# Patient Record
Sex: Female | Born: 1960 | Race: White | Hispanic: No | Smoking: Never smoker
Health system: Southern US, Community
[De-identification: ages and names within clinical notes are randomized; demographics above are authoritative.]

---

## 2007-12-17 ENCOUNTER — Encounter: Payer: Self-pay | Admitting: Internal Medicine

## 2007-12-18 ENCOUNTER — Encounter: Payer: Self-pay | Admitting: Internal Medicine

## 2008-01-18 ENCOUNTER — Encounter: Payer: Self-pay | Admitting: Internal Medicine

## 2008-02-15 ENCOUNTER — Encounter: Payer: Self-pay | Admitting: Internal Medicine

## 2008-04-30 ENCOUNTER — Encounter: Payer: Self-pay | Admitting: Internal Medicine

## 2008-05-17 ENCOUNTER — Encounter: Payer: Self-pay | Admitting: Internal Medicine

## 2008-06-16 ENCOUNTER — Encounter: Payer: Self-pay | Admitting: Internal Medicine

## 2012-04-28 ENCOUNTER — Ambulatory Visit: Payer: Self-pay

## 2012-04-28 LAB — RAPID INFLUENZA A&B ANTIGENS

## 2013-03-12 ENCOUNTER — Ambulatory Visit: Payer: Self-pay

## 2013-03-12 LAB — RAPID INFLUENZA A&B ANTIGENS

## 2013-03-26 ENCOUNTER — Ambulatory Visit: Payer: Self-pay

## 2014-05-18 LAB — BASIC METABOLIC PANEL
Anion Gap: 6 — ABNORMAL LOW (ref 7–16)
BUN: 19 mg/dL — ABNORMAL HIGH (ref 7–18)
Calcium, Total: 8.5 mg/dL (ref 8.5–10.1)
Chloride: 105 mmol/L (ref 98–107)
Co2: 26 mmol/L (ref 21–32)
Creatinine: 0.89 mg/dL (ref 0.60–1.30)
EGFR (African American): >60
GLUCOSE: 80 mg/dL (ref 65–99)
OSMOLALITY: 275 (ref 275–301)
Potassium: 3.9 mmol/L (ref 3.5–5.1)
SODIUM: 137 mmol/L (ref 136–145)

## 2014-05-18 LAB — CBC
HCT: 40.6 % (ref 35.0–47.0)
HGB: 13.3 g/dL (ref 12.0–16.0)
MCH: 27.8 pg (ref 26.0–34.0)
MCHC: 32.7 g/dL (ref 32.0–36.0)
MCV: 85 fL (ref 80–100)
Platelet: 244 10*3/uL (ref 150–440)
RBC: 4.78 10*6/uL (ref 3.80–5.20)
RDW: 14.1 % (ref 11.5–14.5)
WBC: 12.4 10*3/uL — ABNORMAL HIGH (ref 3.6–11.0)

## 2014-05-18 LAB — PROTIME-INR
INR: 1.1
Prothrombin Time: 13.6 secs (ref 11.5–14.7)

## 2014-05-18 LAB — TROPONIN I: Troponin-I: 0.05 ng/mL

## 2014-05-19 ENCOUNTER — Inpatient Hospital Stay: Payer: Self-pay | Admitting: Internal Medicine

## 2014-05-19 LAB — APTT
Activated PTT: 28.1 secs (ref 23.6–35.9)
Activated PTT: 88.4 secs — ABNORMAL HIGH (ref 23.6–35.9)

## 2014-05-19 LAB — TROPONIN I
Troponin-I: 0.07 ng/mL — ABNORMAL HIGH
Troponin-I: 0.12 ng/mL — ABNORMAL HIGH

## 2014-05-19 LAB — CK-MB
CK-MB: 0.9 ng/mL (ref 0.5–3.6)
CK-MB: 1.1 ng/mL (ref 0.5–3.6)
CK-MB: 1.2 ng/mL (ref 0.5–3.6)

## 2014-05-20 LAB — CBC WITH DIFFERENTIAL/PLATELET
BASOS ABS: 0.1 10*3/uL (ref 0.0–0.1)
Basophil %: 0.6 %
EOS PCT: 2 %
Eosinophil #: 0.2 10*3/uL (ref 0.0–0.7)
HCT: 37.5 % (ref 35.0–47.0)
HGB: 12.3 g/dL (ref 12.0–16.0)
Lymphocyte #: 2.5 10*3/uL (ref 1.0–3.6)
Lymphocyte %: 22.5 %
MCH: 28.2 pg (ref 26.0–34.0)
MCHC: 32.7 g/dL (ref 32.0–36.0)
MCV: 86 fL (ref 80–100)
Monocyte #: 1 x10 3/mm — ABNORMAL HIGH (ref 0.2–0.9)
Monocyte %: 8.9 %
NEUTROS ABS: 7.3 10*3/uL — AB (ref 1.4–6.5)
NEUTROS PCT: 66 %
PLATELETS: 199 10*3/uL (ref 150–440)
RBC: 4.35 10*6/uL (ref 3.80–5.20)
RDW: 14.4 % (ref 11.5–14.5)
WBC: 11.1 10*3/uL — ABNORMAL HIGH (ref 3.6–11.0)

## 2014-05-20 LAB — BASIC METABOLIC PANEL
Anion Gap: 5 — ABNORMAL LOW (ref 7–16)
BUN: 17 mg/dL (ref 7–18)
CALCIUM: 8.5 mg/dL (ref 8.5–10.1)
CO2: 29 mmol/L (ref 21–32)
Chloride: 105 mmol/L (ref 98–107)
Creatinine: 0.88 mg/dL (ref 0.60–1.30)
EGFR (African American): >60
EGFR (Non-African Amer.): >60
Glucose: 103 mg/dL — ABNORMAL HIGH (ref 65–99)
OSMOLALITY: 279 (ref 275–301)
Potassium: 4.4 mmol/L (ref 3.5–5.1)
Sodium: 139 mmol/L (ref 136–145)

## 2014-05-20 LAB — PROTIME-INR
INR: 1.4
PROTHROMBIN TIME: 16.8 s — AB (ref 11.5–14.7)

## 2014-05-20 LAB — APTT: Activated PTT: 31.5 secs (ref 23.6–35.9)

## 2015-04-09 NOTE — Discharge Summary (Signed)
PATIENT NAME:  Tonya Clayton, Tonya Clayton MR#:  [id number] DATE OF BIRTH:  Dec 15, 1961  DATE OF ADMISSION:  05/19/2014 DATE OF DISCHARGE:  05/20/2014  PRIMARY CARE PHYSICIAN: Nonlocal.  FINAL DIAGNOSES: 1.  Acute pulmonary embolism and left leg deep vein thrombosis.  2.  Heavy menses.  3.  Obesity.   MEDICATIONS ON DISCHARGE:  Include Xarelto 15 mg twice a day for 20 more days, then go back to your usual 20 mg daily dosing lifelong afterwards; oxycodone 5 mg every 6 hours as needed for pain.   DIET: Regular diet, regular consistency.   ACTIVITY: As tolerated.   Can follow up with Dr. Greggory Keen, gynecology, as needed for heavy menses.     HOSPITAL COURSE: The patient was admitted 05/19/2014, and discharged 05/20/2014. Came in with shortness of breath.   HISTORY OF PRESENT ILLNESS: A 54 year old female with history of DVT and PE, comes in with shortness of breath for 2 days. She was diagnosed with pulmonary embolism back in the 1990s. She developed another clot back in April 2014. She has been on Xarelto.  Two weeks back she stopped her Xarelto secondary to heavy menstruation and comes in with further shortness of breath. She did have an IVC filter last year. In the ER, she was diagnosed with pulmonary embolism and hospitalist services admitted the patient and started the patient on heparin drip.   LABORATORY AND RADIOLOGICAL DATA DURING THE HOSPITAL COURSE: Included an EKG that showed normal sinus rhythm. No acute ST-T wave changes. Glucose 80, BUN 19, creatinine 0.89, sodium 137, potassium 3.9, chloride 105, CO2 of 26, calcium 8.5. White blood cell count 12.4, Hemoglobin 13.3, hematocrit 40.6, platelet count of 244. Troponin negative. Chest x-ray showed no active cardiopulmonary disease. Troponin borderline at 0.07. CT angio of the chest showed positive for acute pulmonary embolism, right heart strain consistent with PE. 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  Echocardiogram showed an EF of 60% to 65%. Elevated mean left atrial pressure. Impaired relaxation of LV diastolic filling. Enlarged right ventricle. Left atrium is normal. Moderately dilated right atrium. Ultrasound of the lower extremity, bilateral, showed occlusive left popliteal DVT.  Hemoglobin upon discharge 12.3, creatinine 0.88.   The patient was seen in consultation by Dr. Greggory Keen, who recommended maintaining a menstrual calendar over the next 3 months and notify him if bleeding greater than 7 days or excessive bleeding. No medication at this time.   HOSPITAL COURSE PER PROBLEM LIST:  1.  For the patient's acute pulmonary embolism and left leg deep vein thrombosis, the patient was advised not to stop her Xarelto. Since she did have an acute pulmonary embolism, I had to change the dose of the Xarelto to 15 mg twice a day for 20 days, and then go back to her usual 20 mg daily. She will need to be on anticoagulation lifelong secondary to repeated blood clots.  2.  Heavy menses. Can follow up with Dr. Greggory Keen. Needs to stay on the anticoagulation at this point.  3. Obesity with a body mass index of 40.3. Weight loss is needed.   Of note, benefits and risks of blood thinner explained to the patient. The patient has to stay on the Xarelto at this point in time to prevent complications of a blood clot. Risks of the blood thinner explained to the patient at length.    TIME SPENT ON DISCHARGE: 35 minutes.    ____________________________ Herschell Dimes. Renae Gloss, MD rjw:dmm D: 05/20/2014 15:02:00 ET  T: 05/20/2014 20:03:54 ET JOB#: [id number]  cc: Herschell Dimesichard J. Renae GlossWieting, MD, <Dictator> Prentice DockerMartin A. DeFrancesco, MD Salley ScarletICHARD J Topanga Alvelo MD ELECTRONICALLY SIGNED 05/25/2014 10:20

## 2015-04-09 NOTE — Consult Note (Signed)
PATIENT NAME:  Tonya Clayton, [name] MR#:  [id number] DATE OF BIRTH:  24-Jul-1961  DATE OF CONSULTATION:  05/19/2014  REFERRING PHYSICIAN:  Alford Highlandichard Wieting, MD CONSULTING PHYSICIAN:  Prentice DockerMartin A. Albion Weatherholtz, MD  REASON FOR CONSULTATION:  Heavy periods.  HISTORY OF PRESENT ILLNESS: The patient is a 54 year old white female, para 0-1-0-0, having regular menstrual cycles on a monthly basis until May 2015 when she had an episode of amenorrhea followed by a heavy cycle lasting less than 7 days duration. The patient was recently admitted with recurrent DVT and pulmonary embolism for anticoagulation.   PAST GYNECOLOGIC HISTORY: Notable for regular menstrual cycles with intervals approximately every 28 days. Duration of flow typically would last 5 to 6 days. She denies history of intermenstrual bleeding. The patient states that her cycles have been regular up until April when she had an episode of amenorrhea. Subsequent menses in May was noted to be heavy.  SIGNIFICANT PAST HISTORY:  Notable for the patient having had DVT and pulmonary embolus in the past which prompted her to be long-term Xarelto therapy. She had since discontinued the medication because of concern about her cycles.  PAST MEDICAL HISTORY:  Past history of DVT and PE.   PAST SURGICAL HISTORY: IVC filter placement.  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   FAMILY HISTORY: Positive for hypertension. No family history of pulmonary embolism.   SOCIAL HISTORY: The patient does not smoke, does not drink, does not use drugs.   PHYSICAL EXAMINATION: Deferred at this time.   IMPRESSION: 1.  New onset recurrent deep vein thrombosis/pulmonary embolism.  2.  Perimenopause with recent heavy menses following 2 months of amenorrhea. Bleeding not prolonged. Previous history of menses on Xarelto have been normal.   RECOMMENDATIONS:  1.  Maintain menstrual calendar monitor over the next 3 months. Notify me if  there is any bleeding greater than 7 days or if there is any excessive bleeding.  2.  Follow up with me in 3 months for reassessment.  3.  No medication intervention is necessary at this time unless bleeding becomes excessive, heavy or prolonged.    ____________________________ Prentice DockerMartin A. Saida Lonon, MD mad:ce D: 05/19/2014 14:07:21 ET T: 05/19/2014 14:26:16 ET JOB#: [id number]  cc: Daphine DeutscherMartin A. Katheryne Gorr, MD, <Dictator> [facility] Prentice DockerMARTIN A Heide Brossart MD ELECTRONICALLY SIGNED 06/16/2014 17:08

## 2015-04-09 NOTE — H&P (Signed)
PATIENT NAME:  Tonya Clayton, Tonya Clayton MR#:  [id number] DATE OF BIRTH:  07-24-1961  DATE OF ADMISSION:  05/19/2014  PRIMARY CARE PHYSICIAN:  Nonlocal.   REFERRING PHYSICIAN:  Dr. Sharman Clayton.   CHIEF COMPLAINT:  Shortness of breath.   HISTORY OF PRESENT ILLNESS:  Ms. Tonya Clayton is a 54 year old, pleasant, white female with a past medical history of DVT, PE that presented to the Emergency Department with complaints of shortness of breath for the last two days.  The patient was initially diagnosed with a pulmonary embolism in the 1990s.  At that time it was attributed to birth control pills.  The patient took Coumadin for a year and was discontinued.  The patient developed another clot in April 2014.  Since then, the patient has been on Xarelto.  About two weeks back, the patient was having heavy menstruation.  Concerning this, the patient took herself of Xarelto.  For the last two days, has been experiencing shortness of breath with mild exertion.  The patient has to rest for some time before she walked.  Had mild chest pain.  Denies having any cough.  The patient usually sits most of the day at work.  Noticed to have increased swelling in the left lower extremity.  The patient had IVC filter placed last year.  Work-up in the Emergency Department, the patient is found to have right lung submassive PE.  The patient was initiated on heparin drip.  PAST MEDICAL HISTORY:  Previous history of pulmonary embolism.   PAST SURGICAL HISTORY:  IVC filter placement.   ALLERGIES:  No known drug allergies.   HOME MEDICATIONS:  Currently none.   SOCIAL HISTORY:  No history of smoking, drinking alcohol or using illicit.  Lives with her boyfriend.  Works for [employer].   FAMILY HISTORY:  No family history of pulmonary embolism.  Has history of hypertension.   REVIEW OF SYSTEMS: CONSTITUTIONAL:  Denies any generalized weakness.  EYES:  No change in vision.   EARS, NOSE, THROAT:  No change in hearing.  RESPIRATORY:   Has shortness of breath.  CARDIOVASCULAR:  Has mild chest pain.  GASTROINTESTINAL:  No nausea, vomiting, abdominal pain.  GENITOURINARY:  No dysuria or hematuria.  ENDOCRINE:  No polyuria or polydipsia.  HEMATOLOGY:  No easy bruising or bleeding.  SKIN:  No rash or lesions. MUSCULOSKELETAL:  No joint pains and aches. NEUROLOGIC:  No weakness or numbness in any part of the body.   PHYSICAL EXAMINATION:  GENERAL:  This is a well-built, well-nourished, age-appropriate female lying down in the bed, not in distress.  VITAL SIGNS:  Temperature 98.5, pulse 76, blood pressure 135/85, respiratory rate of 16, oxygen saturation 99% on room air.  HEENT:  Head normocephalic, atraumatic.  Eyes, no scleral icterus.  Conjunctivae normal.  Pupils equal and react to light.  Extraocular movements are intact.  Mucous membranes moist.  No pharyngeal erythema.  NECK:  Supple.  No lymphadenopathy.  No JVD.  No carotid bruit.  No thyromegaly.  CHEST:  Has no focal tenderness.  LUNGS:  Bilaterally clear to auscultation.  HEART:  S1, S2 regular.  No murmurs are heard.  ABDOMEN:  Bowel sounds plus.  Soft, nontender, nondistended.  Obese abdomen.  Could not appreciate any hepatosplenomegaly.  EXTREMITIES:  Left lower extremity is bigger than the right, has 1 to 2+ pitting edema extending up to the thighs.  SKIN:  No rashes or lesions.  MUSCULOSKELETAL:  Good range of motion in all the extremities.  NEUROLOGIC:  The patient is alert, oriented to place, person, and time.  Cranial nerves II through XII intact.  Motor 5 by 5 in upper and lower extremities.   LABORATORY DATA:  Troponin 0.07.  CTA of the chest showed positive for acute pulmonary embolism with right heart strain consistent with at least submassive pulmonary embolism, right heart strain associated with increased risk of morbidity and mortality.  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  Recommended abdominal CT.  Chest  x-ray, one view portable:  No acute cardiopulmonary disease.   Coag profile, within normal limits.  CBC, CMP are completely within normal limits.  Troponin 0.05.   EKG, 12-lead:  Shows normal sinus rhythm with no ST-T wave abnormalities.      ASSESSMENT AND PLAN:  Ms. Tonya Clayton is a 54 year old female with a known history of pulmonary embolism who comes to the Emergency Department with pulmonary embolism.  1.  Pulmonary embolism.  The patient has recurrent deep vein thrombosis and pulmonary embolism.  The patient does not know if any coagulopathy work-up was done in the past.  We will send the workup.  We will obtain echocardiogram, admit the patient to the monitored bed.  Continue with heparin drip.  We will obtain venous Dopplers. 2.  Obesity.  Counseled with the patient regarding diet and exercise.   TIME SPENT:  45 minutes.    ____________________________ Tonya GriffinsPadmaja Jahaan Vanwagner, MD pv:ea D: 05/19/2014 03:40:24 ET T: 05/19/2014 04:33:11 ET JOB#: [id number]  cc: Tonya GriffinsPadmaja Alpa Salvo, MD, <Dictator> Tonya GriffinsPADMAJA Alonni Heimsoth MD ELECTRONICALLY SIGNED 05/27/2014 21:07

## 2015-08-17 IMAGING — CT CT ANGIO CHEST
2 of 6 series · 18 of 36 positions shown · IV contrast (APPLIED)
Comparison: None.

CLINICAL DATA: Retrosternal chest pain. History of pulmonary
embolism.

EXAM:
CT ANGIOGRAPHY CHEST WITH CONTRAST
TECHNIQUE: Multidetector CT imaging of the chest was performed using the
standard protocol during bolus administration of intravenous
contrast. Multiplanar CT image reconstructions and MIPs were
obtained to evaluate the vascular anatomy.
CONTRAST:  100 cc Isovue 370 intravenous

[Series 5: pe 1.0 thins · axial · 0.66mm/px · z∈[-321,-104]mm · 17 of 245 slices shown]
[im 14/245  lung]
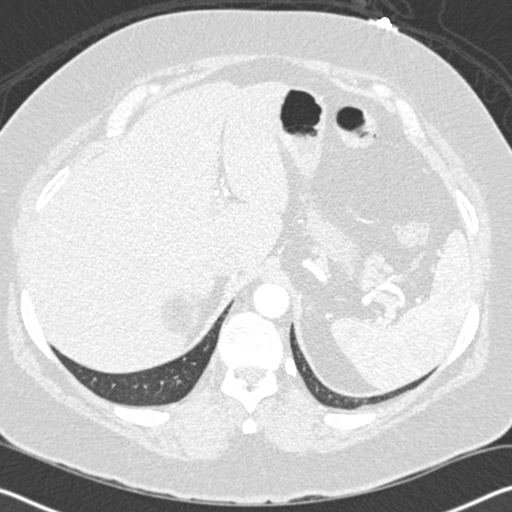
[im 28/245  mediastinal]
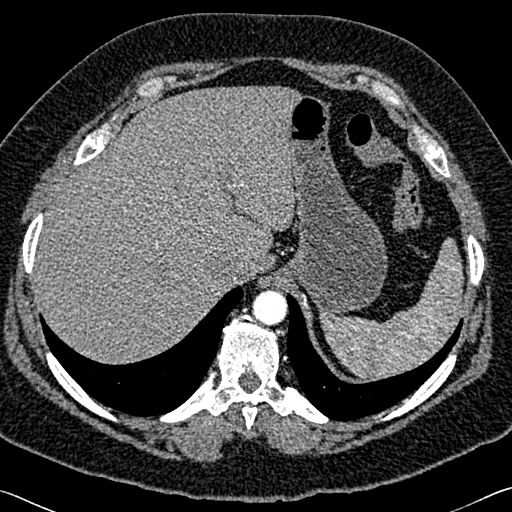
[im 41/245  lung]
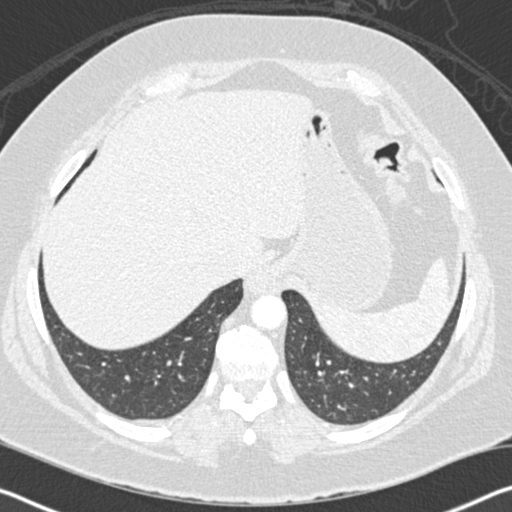
[im 55/245  mediastinal]
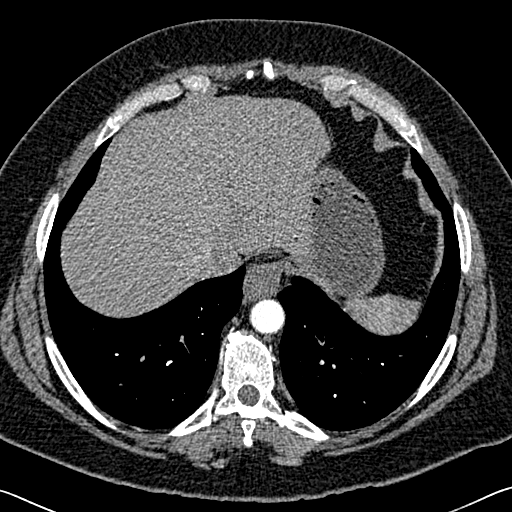
[im 68/245  lung]
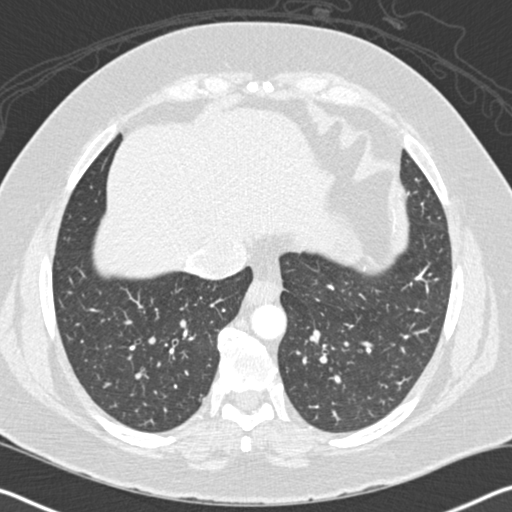
[im 82/245  mediastinal]
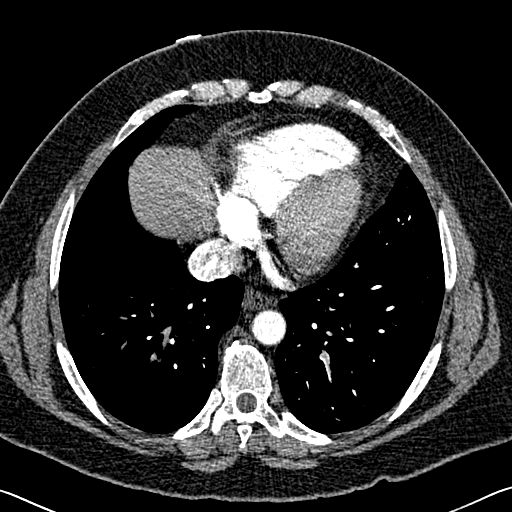
[im 95/245  lung]
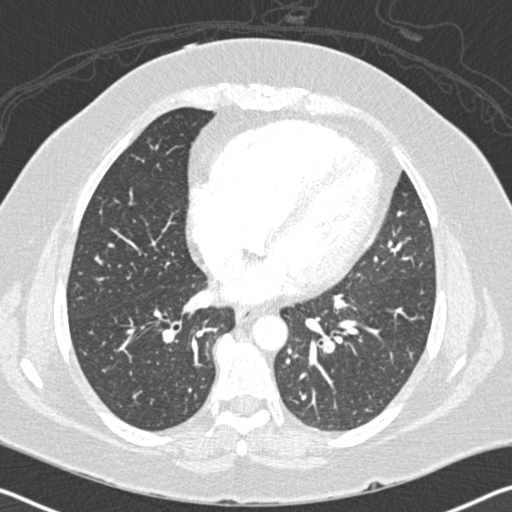
[im 109/245  mediastinal]
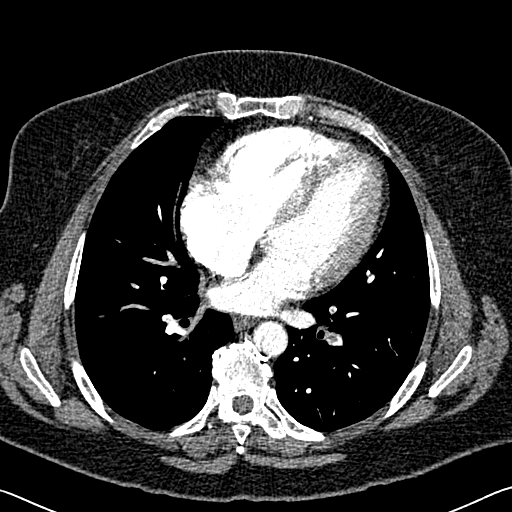
[im 123/245  lung]
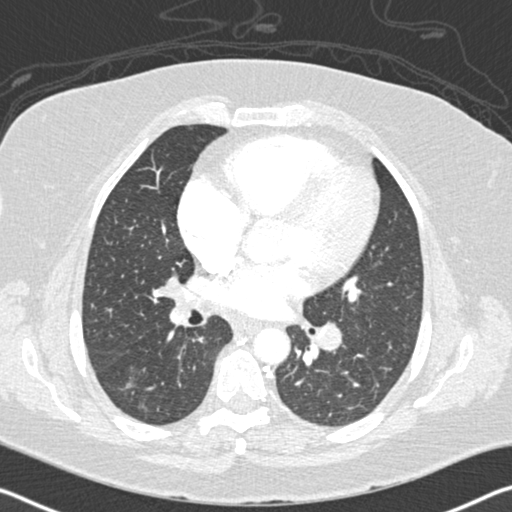
[im 136/245  mediastinal]
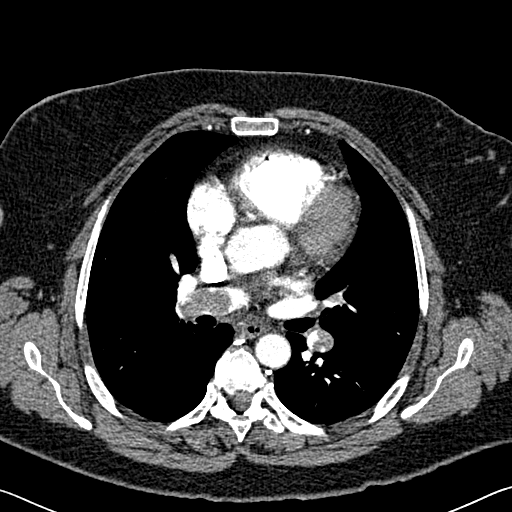
[im 150/245  lung]
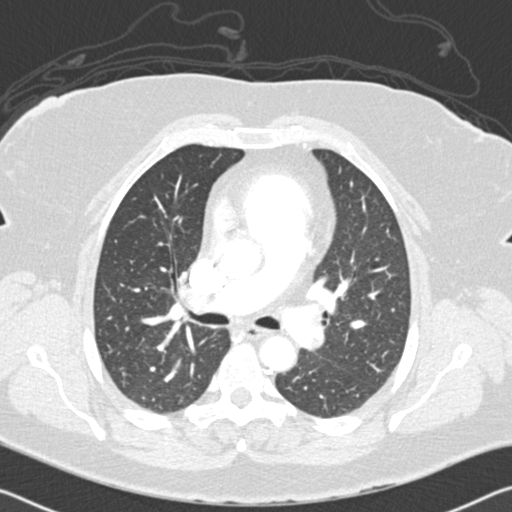
[im 163/245  mediastinal]
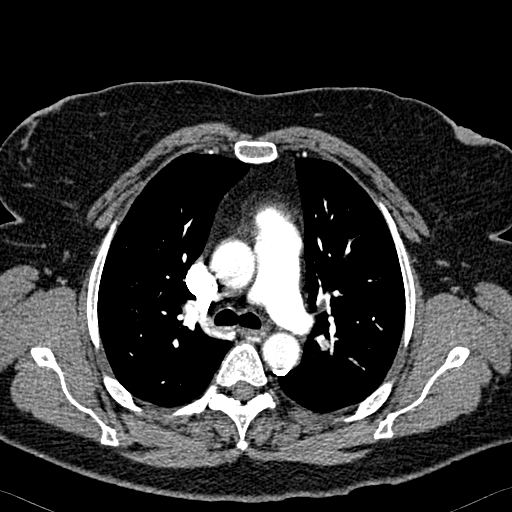
[im 177/245  lung]
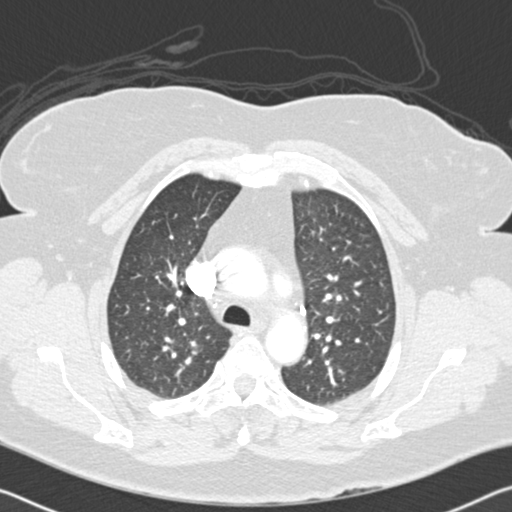
[im 190/245  mediastinal]
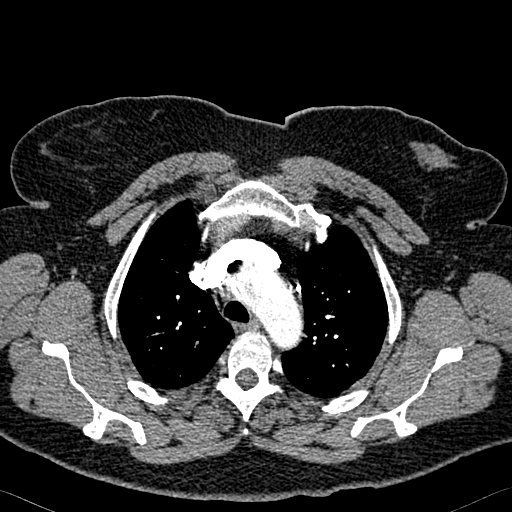
[im 204/245  lung]
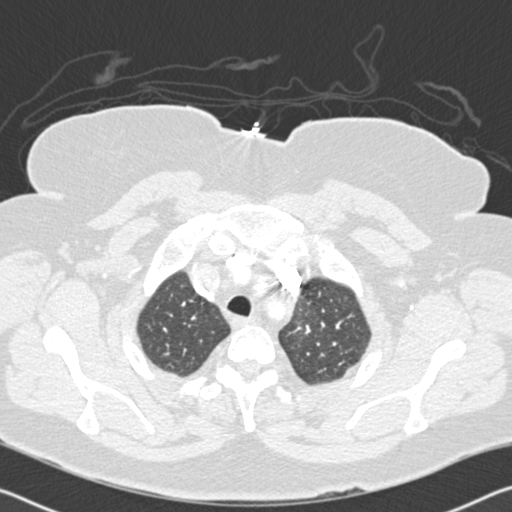
[im 217/245  mediastinal]
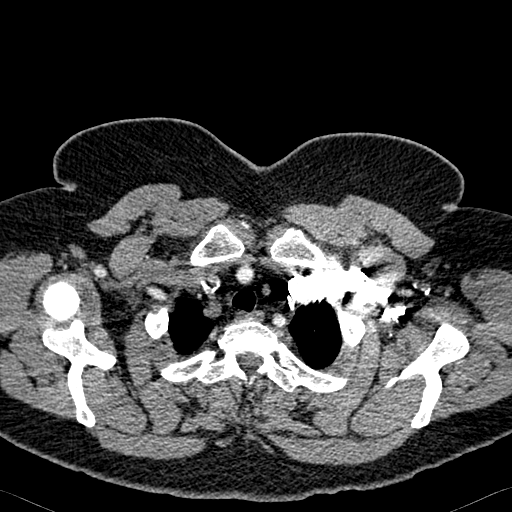
[im 231/245  lung]
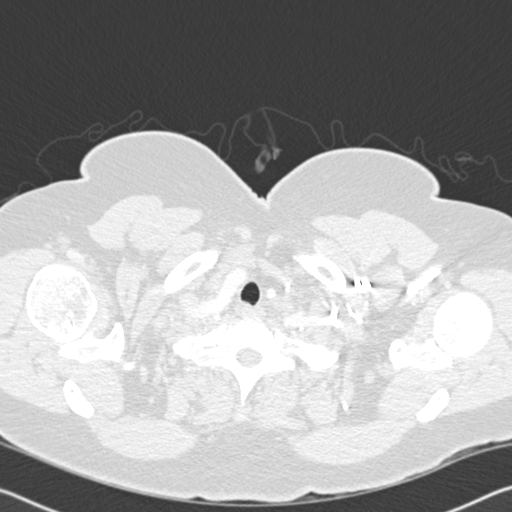

[Series 7: cor pe 2.0 mpr · coronal · 0.64mm/px · 1 of 124 slices shown]
[im 62/124  mediastinal]
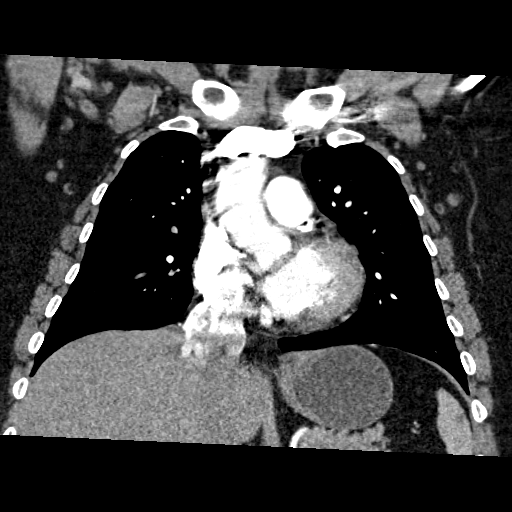

[18 of 36 positions shown; findings below may reference images not displayed]

FINDINGS: THORACIC INLET/BODY WALL:

No acute abnormality.

MEDIASTINUM:

Bilateral central pulmonary embolism, nearly occluding the right
interlobar pulmonary artery. There is abnormal right ventricular
enlargement with a ratio to the left ventricle of 1.1. No
pericardial effusion. Diffuse coronary artery atherosclerosis. No
lymphadenopathy.

LUNG WINDOWS:

Negative for lung infarct.

UPPER ABDOMEN:

Partly imaged fatty mass which appears to arise from the right
adrenal gland, fat compatible with myelolipoma. The mass is large at
5.3 cm.

OSSEOUS:

No acute fracture.  No suspicious lytic or blastic lesions.

Critical Value/emergent results were called by telephone at the time
of interpretation on 05/19/2014 at [DATE] to Dr. EII LAURA MOHOZZO ,
who verbally acknowledged these results.

Review of the MIP images confirms the above findings.
IMPRESSION: 1. Positive for acute pulmonary embolism with right heart strain
(RV/LV Ratio = 1.1), consistent with at least submassive
(intermediate risk) PE. The presence of right heart strain has been
associated with an increased risk of morbidity and mortality.
2. 5 cm fatty mass in the right upper quadrant is likely an adrenal
myelolipoma. After convalescence, recommend dedicated abdominal CT
to confirm adrenal origin.

## 2018-12-13 ENCOUNTER — Other Ambulatory Visit: Payer: Self-pay

## 2018-12-13 ENCOUNTER — Encounter: Payer: Self-pay | Admitting: Emergency Medicine

## 2018-12-13 ENCOUNTER — Ambulatory Visit
Admission: EM | Admit: 2018-12-13 | Discharge: 2018-12-13 | Disposition: A | Discharge status: Home / Self Care | Payer: 59 | Attending: Family Medicine | Admitting: Family Medicine

## 2018-12-13 DIAGNOSIS — J209 Acute bronchitis, unspecified: Secondary | ICD-10-CM | POA: Diagnosis not present

## 2018-12-13 MED ORDER — HYDROCOD POLST-CPM POLST ER 10-8 MG/5ML PO SUER: 5.0000 mL | Freq: Every evening | ORAL | 0 refills | Status: AC | PRN

## 2018-12-13 MED ORDER — BENZONATATE 100 MG PO CAPS: 100.0000 mg | ORAL_CAPSULE | Freq: Three times a day (TID) | ORAL | 0 refills | Status: AC | PRN

## 2018-12-13 MED ORDER — DOXYCYCLINE HYCLATE 100 MG PO CAPS: 100.0000 mg | ORAL_CAPSULE | Freq: Two times a day (BID) | ORAL | 0 refills | Status: AC

## 2018-12-13 NOTE — ED Triage Notes (Signed)
Patient c/o cough and nasal congestion x 4 weeks. Patient states she has been taking OTC Alka-Seltzer cold and plus.

## 2018-12-13 NOTE — ED Provider Notes (Signed)
[facility] ____________________________________________  Time seen: Approximately 5:48 PM  I have reviewed the triage vital signs and the nursing notes.   HISTORY  Chief Complaint Cough and Nasal Congestion   HPI Tonya Clayton is a 57 y.o. female presenting for evaluation of 3 to 4 weeks of runny nose, nasal congestion, cough.  States she started to feel better after 2 weeks, but reports over the last 1 week symptoms have increased with increased congestion, postnasal drainage as well as the cough.  States his continue to remain active.  States cough is predominantly a dry tickly cough.  Occasional sore throat, mostly associated with cough.  Denies any known fevers.  Some nasal congestion.  Denies persistent sinus pain.  Clear nasal drainage.  Has been taking over-the-counter cough and congestion medication without resolution.  Denies accompanying chest pain or shortness of breath.  Denies other relieving factors.  Reports otherwise doing well. Reports feels consistent with previous bronchitis infections.    History reviewed. No pertinent past medical history. Denies  There are no active problems to display for this patient.   History reviewed. No pertinent surgical history.   No current facility-administered medications for this encounter.   Current Outpatient Medications:  .  benzonatate (TESSALON PERLES) 100 MG capsule, Take 1 capsule (100 mg total) by mouth 3 (three) times daily as needed for cough., Disp: 15 capsule, Rfl: 0 .  chlorpheniramine-HYDROcodone (TUSSIONEX PENNKINETIC ER) 10-8 MG/5ML SUER, Take 5 mLs by mouth at bedtime as needed for cough. do not drive or operate machinery while taking as can cause drowsiness., Disp: 50 mL, Rfl: 0 .  doxycycline (VIBRAMYCIN) 100 MG capsule, Take 1 capsule (100 mg total) by mouth 2 (two) times daily., Disp: 20 capsule, Rfl: 0  Allergies Patient has no known allergies.  Family History  Problem Relation Age of Onset    . Diabetes Father     Social History Social History   Tobacco Use  . Smoking status: Never Smoker  . Smokeless tobacco: Never Used  Substance Use Topics  . Alcohol use: Yes  . Drug use: Never    Review of Systems Constitutional: No fever ENT: As above.  Cardiovascular: Denies chest pain. Respiratory: Denies shortness of breath. Gastrointestinal: No abdominal pain.   Musculoskeletal: Negative for back pain. Skin: Negative for rash.  ____________________________________________   PHYSICAL EXAM:  VITAL SIGNS: ED Triage Vitals  Enc Vitals Group     BP 12/13/18 1604 135/76     Pulse Rate 12/13/18 1604 85     Resp 12/13/18 1604 18     Temp 12/13/18 1604 98.4 F (36.9 C)     Temp Source 12/13/18 1604 Oral     SpO2 12/13/18 1604 97 %     Weight 12/13/18 1602 241 lb (109.3 kg)     Height 12/13/18 1602 5\' 5"  (1.651 m)     Head Circumference --      Peak Flow --      Pain Score 12/13/18 1601 0     Pain Loc --      Pain Edu? --      Excl. in GC? --     Constitutional: Alert and oriented. Well appearing and in no acute distress. Eyes: Conjunctivae are normal.  Head: Atraumatic. No sinus tenderness to palpation. No swelling. No erythema.  Ears: no erythema, normal TMs bilaterally.   Nose:Nasal congestion   Mouth/Throat: Mucous membranes are moist. No pharyngeal erythema. No tonsillar swelling or exudate.  Neck: No stridor.  No cervical spine tenderness to palpation. Hematological/Lymphatic/Immunilogical: No cervical lymphadenopathy. Cardiovascular: Normal rate, regular rhythm. Grossly normal heart sounds.  Good peripheral circulation. Respiratory: Normal respiratory effort.  No retractions. No wheezes.  Mild scattered rhonchi.  Good air movement.  Dry intermittent cough noted in room. Musculoskeletal: Ambulatory with steady gait.  No lower extremity edema noted bilaterally. Neurologic:  Normal speech and language. No gait instability. Skin:  Skin appears warm, dry and  intact. No rash noted. Psychiatric: Mood and affect are normal. Speech and behavior are normal. ___________________________________________   LABS (all labs ordered are listed, but only abnormal results are displayed)  Labs Reviewed - No data to display   PROCEDURES Procedures    INITIAL IMPRESSION / ASSESSMENT AND PLAN / ED COURSE  Pertinent labs & imaging results that were available during my care of the patient were reviewed by me and considered in my medical decision making (see chart for details).  Well-appearing patient.  No acute distress.  Suspect recent viral illness.  Suspect acute bronchitis.  As symptoms have lasted greater than 3 weeks will empirically treat with oral doxycycline.  PRN Tessalon Perles and PRN Tussionex.  Encourage rest, fluids, supportive care.  Discussed follow-up and return parameters.Discussed indication, risks and benefits of medications with patient.  Discussed follow up with Primary care physician this week. Discussed follow up and return parameters including no resolution or any worsening concerns. Patient verbalized understanding and agreed to plan.   ____________________________________________   FINAL CLINICAL IMPRESSION(S) / ED DIAGNOSES  Final diagnoses:  Acute bronchitis, unspecified organism     ED Discharge Orders         Ordered    doxycycline (VIBRAMYCIN) 100 MG capsule  2 times daily     12/13/18 1636    benzonatate (TESSALON PERLES) 100 MG capsule  3 times daily PRN     12/13/18 1636    chlorpheniramine-HYDROcodone (TUSSIONEX PENNKINETIC ER) 10-8 MG/5ML SUER  At bedtime PRN     12/13/18 1639           Note: This dictation was prepared with Dragon dictation along with smaller phrase technology. Any transcriptional errors that result from this process are unintentional.         Renford DillsMiller, Martise Waddell, NP 12/13/18 1751

## 2018-12-13 NOTE — Discharge Instructions (Addendum)
Take medication as prescribed. Rest. Drink plenty of fluids. Over the counter mucinex.   Follow up with your primary care physician this week as needed. Return to Urgent care for new or worsening concerns.   

## 2021-12-03 ENCOUNTER — Other Ambulatory Visit: Payer: Self-pay

## 2021-12-03 ENCOUNTER — Ambulatory Visit
Admission: EM | Admit: 2021-12-03 | Discharge: 2021-12-03 | Disposition: A | Discharge status: Other Acute Inpatient Hospital | Payer: BLUE CROSS/BLUE SHIELD

## 2021-12-03 NOTE — ED Triage Notes (Signed)
Established patient here for "Toe pain".  "Horse stepped on it". DOI: [id number]. Time: "230pm". Right foot Great toe & 2nd.

## 2021-12-03 NOTE — ED Triage Notes (Signed)
Current symptoms and concerns discussed with provider prior to futher intake. Referred to ED due to extent of injury & possible "IV Antibiotics due to crush injury/bone involvement".
